# Patient Record
Sex: Female | Born: 1995 | Race: White | Hispanic: No | Marital: Single | Smoking: Never smoker
Health system: Southern US, Community
[De-identification: ages and names within clinical notes are randomized; demographics above are authoritative.]

## PROBLEM LIST (undated history)

## (undated) DIAGNOSIS — D839 Common variable immunodeficiency, unspecified: Secondary | ICD-10-CM

## (undated) DIAGNOSIS — D696 Thrombocytopenia, unspecified: Secondary | ICD-10-CM

## (undated) DIAGNOSIS — Q219 Congenital malformation of cardiac septum, unspecified: Secondary | ICD-10-CM

## (undated) DIAGNOSIS — R7989 Other specified abnormal findings of blood chemistry: Secondary | ICD-10-CM

## (undated) DIAGNOSIS — Q130 Coloboma of iris: Secondary | ICD-10-CM

## (undated) DIAGNOSIS — Q02 Microcephaly: Secondary | ICD-10-CM

## (undated) DIAGNOSIS — Q898 Other specified congenital malformations: Secondary | ICD-10-CM

## (undated) DIAGNOSIS — H9193 Unspecified hearing loss, bilateral: Secondary | ICD-10-CM

## (undated) DIAGNOSIS — E049 Nontoxic goiter, unspecified: Secondary | ICD-10-CM

## (undated) DIAGNOSIS — Q605 Renal hypoplasia, unspecified: Secondary | ICD-10-CM

## (undated) DIAGNOSIS — R945 Abnormal results of liver function studies: Secondary | ICD-10-CM

## (undated) DIAGNOSIS — R6250 Unspecified lack of expected normal physiological development in childhood: Secondary | ICD-10-CM

## (undated) DIAGNOSIS — Z8774 Personal history of (corrected) congenital malformations of heart and circulatory system: Secondary | ICD-10-CM

## (undated) DIAGNOSIS — E063 Autoimmune thyroiditis: Secondary | ICD-10-CM

## (undated) DIAGNOSIS — Z931 Gastrostomy status: Secondary | ICD-10-CM

## (undated) DIAGNOSIS — R620 Delayed milestone in childhood: Secondary | ICD-10-CM

## (undated) DIAGNOSIS — N938 Other specified abnormal uterine and vaginal bleeding: Secondary | ICD-10-CM

## (undated) DIAGNOSIS — R51 Headache: Secondary | ICD-10-CM

## (undated) DIAGNOSIS — R000 Tachycardia, unspecified: Secondary | ICD-10-CM

## (undated) DIAGNOSIS — L80 Vitiligo: Secondary | ICD-10-CM

## (undated) HISTORY — PX: MYRINGOTOMY WITH TUBE PLACEMENT: SHX5663

## (undated) HISTORY — DX: Gastrostomy status: Z93.1

## (undated) HISTORY — PX: EYE MUSCLE SURGERY: SHX370

## (undated) HISTORY — PX: OTHER SURGICAL HISTORY: SHX169

## (undated) HISTORY — DX: Other specified congenital malformations: Q89.8

## (undated) HISTORY — DX: Autoimmune thyroiditis: E06.3

## (undated) HISTORY — DX: Tachycardia, unspecified: R00.0

## (undated) HISTORY — DX: Coloboma of iris: Q13.0

## (undated) HISTORY — DX: Unspecified lack of expected normal physiological development in childhood: R62.50

## (undated) HISTORY — PX: ASD REPAIR: SHX258

## (undated) HISTORY — PX: APPENDECTOMY: SHX54

## (undated) HISTORY — DX: Thrombocytopenia, unspecified: D69.6

## (undated) HISTORY — PX: CARDIAC SURGERY: SHX584

## (undated) HISTORY — DX: Congenital malformation of cardiac septum, unspecified: Q21.9

## (undated) HISTORY — PX: SINUS SURGERY WITH INSTATRAK: SHX5215

## (undated) HISTORY — DX: Other specified abnormal uterine and vaginal bleeding: N93.8

## (undated) HISTORY — DX: Renal hypoplasia, unspecified: Q60.5

## (undated) HISTORY — DX: Personal history of (corrected) congenital malformations of heart and circulatory system: Z87.74

## (undated) HISTORY — DX: Abnormal results of liver function studies: R94.5

## (undated) HISTORY — PX: VAGINAL HYSTERECTOMY: SUR661

## (undated) HISTORY — DX: Common variable immunodeficiency, unspecified: D83.9

## (undated) HISTORY — PX: LIVER BIOPSY: SHX301

## (undated) HISTORY — DX: Microcephaly: Q02

## (undated) HISTORY — DX: Unspecified hearing loss, bilateral: H91.93

## (undated) HISTORY — DX: Vitiligo: L80

## (undated) HISTORY — DX: Nontoxic goiter, unspecified: E04.9

## (undated) HISTORY — DX: Other specified abnormal findings of blood chemistry: R79.89

## (undated) HISTORY — PX: NISSEN FUNDOPLICATION: SHX2091

## (undated) HISTORY — DX: Headache: R51

## (undated) HISTORY — DX: Delayed milestone in childhood: R62.0

---
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
## 2015-08-20 ENCOUNTER — Other Ambulatory Visit: Payer: Self-pay | Admitting: Student

## 2015-08-20 DIAGNOSIS — K7030 Alcoholic cirrhosis of liver without ascites: Secondary | ICD-10-CM

## 2016-05-31 ENCOUNTER — Emergency Department (HOSPITAL_COMMUNITY)
Admission: EM | Admit: 2016-05-31 | Discharge: 2016-05-31 | Disposition: A | Discharge status: Other Acute Inpatient Hospital | Payer: 59 | Attending: Emergency Medicine | Admitting: Emergency Medicine

## 2016-05-31 ENCOUNTER — Encounter (HOSPITAL_COMMUNITY): Payer: Self-pay | Admitting: Emergency Medicine

## 2016-05-31 DIAGNOSIS — Q898 Other specified congenital malformations: Secondary | ICD-10-CM | POA: Diagnosis not present

## 2016-05-31 DIAGNOSIS — E860 Dehydration: Secondary | ICD-10-CM | POA: Diagnosis not present

## 2016-05-31 DIAGNOSIS — E876 Hypokalemia: Secondary | ICD-10-CM | POA: Diagnosis not present

## 2016-05-31 DIAGNOSIS — R197 Diarrhea, unspecified: Secondary | ICD-10-CM

## 2016-05-31 DIAGNOSIS — D72819 Decreased white blood cell count, unspecified: Secondary | ICD-10-CM

## 2016-05-31 DIAGNOSIS — Z79899 Other long term (current) drug therapy: Secondary | ICD-10-CM | POA: Diagnosis not present

## 2016-05-31 DIAGNOSIS — Q8981 Kabuki syndrome: Secondary | ICD-10-CM

## 2016-05-31 LAB — LIPASE, BLOOD: Lipase: 20 U/L (ref 11–51)

## 2016-05-31 LAB — DIFFERENTIAL
BASOS ABS: 0 10*3/uL (ref 0.0–0.1)
BASOS PCT: 0 %
EOS ABS: 0 10*3/uL (ref 0.0–0.7)
Eosinophils Relative: 0 %
Lymphocytes Relative: 9 %
Lymphs Abs: 0.3 10*3/uL — ABNORMAL LOW (ref 0.7–4.0)
MONO ABS: 0.4 10*3/uL (ref 0.1–1.0)
MONOS PCT: 12 %
NEUTROS ABS: 2.3 10*3/uL (ref 1.7–7.7)
Neutrophils Relative %: 79 %

## 2016-05-31 LAB — CBC
HEMATOCRIT: 38.5 % (ref 36.0–46.0)
Hemoglobin: 13 g/dL (ref 12.0–15.0)
MCH: 26.3 pg (ref 26.0–34.0)
MCHC: 33.8 g/dL (ref 30.0–36.0)
MCV: 77.8 fL — ABNORMAL LOW (ref 78.0–100.0)
Platelets: 99 10*3/uL — ABNORMAL LOW (ref 150–400)
RBC: 4.95 MIL/uL (ref 3.87–5.11)
WBC: 3.1 10*3/uL — AB (ref 4.0–10.5)

## 2016-05-31 LAB — URINALYSIS, ROUTINE W REFLEX MICROSCOPIC
Bilirubin Urine: NEGATIVE
Glucose, UA: NEGATIVE mg/dL
Hgb urine dipstick: NEGATIVE
Ketones, ur: NEGATIVE mg/dL
LEUKOCYTES UA: NEGATIVE
Nitrite: NEGATIVE
PH: 7 (ref 5.0–8.0)
Protein, ur: NEGATIVE mg/dL
Specific Gravity, Urine: 1.004 — ABNORMAL LOW (ref 1.005–1.030)

## 2016-05-31 LAB — COMPREHENSIVE METABOLIC PANEL
ALBUMIN: 3.4 g/dL — AB (ref 3.5–5.0)
ALT: 44 U/L (ref 14–54)
AST: 41 U/L (ref 15–41)
Alkaline Phosphatase: 131 U/L — ABNORMAL HIGH (ref 38–126)
Anion gap: 9 (ref 5–15)
BILIRUBIN TOTAL: 0.5 mg/dL (ref 0.3–1.2)
BUN: <5 mg/dL — ABNORMAL LOW (ref 6–20)
CO2: 22 mmol/L (ref 22–32)
Calcium: 9.1 mg/dL (ref 8.9–10.3)
Chloride: 103 mmol/L (ref 101–111)
Creatinine, Ser: 0.63 mg/dL (ref 0.44–1.00)
GFR calc non Af Amer: >60 mL/min (ref >60)
Glucose, Bld: 91 mg/dL (ref 65–99)
POTASSIUM: 2.3 mmol/L — AB (ref 3.5–5.1)
Sodium: 134 mmol/L — ABNORMAL LOW (ref 135–145)
Total Protein: 7.3 g/dL (ref 6.5–8.1)

## 2016-05-31 LAB — I-STAT BETA HCG BLOOD, ED (MC, WL, AP ONLY)

## 2016-05-31 LAB — MAGNESIUM: MAGNESIUM: 1.9 mg/dL (ref 1.7–2.4)

## 2016-05-31 LAB — I-STAT CG4 LACTIC ACID, ED: LACTIC ACID, VENOUS: 1.24 mmol/L (ref 0.5–1.9)

## 2016-05-31 MED ORDER — KCL IN DEXTROSE-NACL 20-5-0.45 MEQ/L-%-% IV SOLN
Freq: Once | INTRAVENOUS | Status: DC
  Filled 2016-05-31 (×2): qty 1000

## 2016-05-31 MED ORDER — POTASSIUM CHLORIDE CRYS ER 20 MEQ PO TBCR: 40.0000 meq | EXTENDED_RELEASE_TABLET | Freq: Once | ORAL | Status: DC

## 2016-05-31 MED ORDER — SODIUM CHLORIDE 0.9 % IV BOLUS (SEPSIS)
1000.0000 mL | Freq: Once | INTRAVENOUS | Status: AC
  Administered 2016-05-31: 1000 mL via INTRAVENOUS

## 2016-05-31 MED ORDER — POTASSIUM CHLORIDE 2 MEQ/ML IV SOLN
30.0000 meq | Freq: Once | INTRAVENOUS | Status: AC
  Administered 2016-05-31: 30 meq via INTRAVENOUS
  Filled 2016-05-31: qty 15

## 2016-05-31 MED ORDER — MAGNESIUM SULFATE 2 GM/50ML IV SOLN
2.0000 g | Freq: Once | INTRAVENOUS | Status: AC
  Administered 2016-05-31: 2 g via INTRAVENOUS
  Filled 2016-05-31: qty 50

## 2016-05-31 MED ORDER — METRONIDAZOLE IN NACL 5-0.79 MG/ML-% IV SOLN: 500.0000 mg | Freq: Once | INTRAVENOUS | Status: DC

## 2016-05-31 MED ORDER — SODIUM CHLORIDE 0.9 % IV BOLUS (SEPSIS): 1000.0000 mL | Freq: Once | INTRAVENOUS | Status: DC

## 2016-05-31 MED ORDER — PROMETHAZINE HCL 25 MG/ML IJ SOLN
12.5000 mg | Freq: Once | INTRAMUSCULAR | Status: AC
  Administered 2016-05-31: 12.5 mg via INTRAVENOUS
  Filled 2016-05-31: qty 1

## 2016-05-31 MED ORDER — HYDROCORTISONE NA SUCCINATE PF 100 MG IJ SOLR
50.0000 mg | Freq: Once | INTRAMUSCULAR | Status: AC
  Administered 2016-05-31: 50 mg via INTRAVENOUS
  Filled 2016-05-31: qty 2

## 2016-05-31 MED ORDER — FIDAXOMICIN 200 MG PO TABS
200.0000 mg | ORAL_TABLET | Freq: Two times a day (BID) | ORAL | Status: DC
  Administered 2016-05-31: 200 mg via ORAL
  Filled 2016-05-31: qty 1

## 2016-05-31 MED ORDER — DEXTROSE 5 % IV SOLN
2.0000 g | Freq: Once | INTRAVENOUS | Status: AC
  Administered 2016-05-31: 2 g via INTRAVENOUS
  Filled 2016-05-31: qty 2

## 2016-05-31 NOTE — ED Notes (Signed)
IV Team Consult [facility]

## 2016-05-31 NOTE — ED Notes (Signed)
IV Team at bedside 

## 2016-05-31 NOTE — ED Triage Notes (Signed)
PT presents from home via POV with Mom and Dad with c/o watery diarrhea since 05/23/16; both parents also had similar symptoms but have since recovered while pt has not improved. Mom started pt on Vancomycin 250mg  PO TID on 05/23/2016 because of pt history of C-diff in the past without improvement. She has also given Immodium, Tylenol, Zofran, Phenergan, Compazine, Kytril, Ammend without relief. PT denies pain and mom does not think patient has exhibited symptoms of pain. Pt has history of Fecal transplant; [facility]

## 2016-05-31 NOTE — ED Notes (Signed)
[facility] at bedside 

## 2016-05-31 NOTE — ED Notes (Signed)
Spoke with Dr. Erma HeritageIsaacs regarding care and updated patient and her parents on plan.

## 2016-05-31 NOTE — ED Notes (Signed)
2.3 Potassium received. Dr. Erma HeritageIsaacs Aware

## 2016-05-31 NOTE — ED Provider Notes (Signed)
[facility]ENCY DEPT Provider Note   CSN: [id number] Arrival date & time: 05/31/16  0730     History   Chief Complaint Chief Complaint  Patient presents with  . Diarrhea    HPI BONNI NEUSER is a 21 y.o. female.  HPI 21 yo female with past medical history of congenital immune deficiency who presents with fever and diarrhea. Patient has history of recurrent C. Difficile. Per mother report, patient has had increasing abdominal pain, watery diarrhea, and fevers for the last several days. She began by mouth vancomycin 2 days ago with no improvement in her symptoms. Over the last 24 hours, she is in spike fevers to 102. She called her oncologist at [facility] who advised her to present here for initial evaluation followed by transfer. Patient is otherwise hemodynamically stable. She currently states she feels "sick" but denies any significant pain. She's had no blood in her diarrhea. She has had significant nausea as well but has a history of recurrent nausea without vomiting due to Nissen. Said no vomiting.  Past Medical History:  Diagnosis Date  . Cardiac septal defect   . CHARGE association   . Coloboma of eye   . Common variable immunodeficiency (HCC)   . Delayed milestones   . Dysfunctional uterine bleeding   . Elevated LFTs   . Gastrostomy tube in place [facility])   . Goiter   . Headache(784.0)   . Hearing loss of both ears   . Hypoplastic kidney   . Microcephalus (HCC)   . Physical growth delay   . S/P VSD repair   . Tachycardia   . Thrombocytopenia (HCC)   . Thyroiditis, autoimmune   . Vitiligo     Patient Active Problem List   Diagnosis Date Noted  . Migraine without aura, without mention of intractable migraine without mention of status migrainosus 11/08/2012  . Persistent vomiting 11/08/2012  . 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 Delayed milestones 11/08/2012  . Microcephalus (HCC) 11/08/2012  . CHARGE association   . Coloboma of eye   . Cardiac  septal defect   . Physical growth delay   . Hypoplastic kidney   . Hearing loss of both ears   . Thrombocytopenia (HCC)   . Common variable immunodeficiency (HCC)   . Vitiligo   . Thyroiditis, autoimmune   . Goiter   . Dysfunctional uterine bleeding   . Elevated LFTs   . Tachycardia   . Other specified congenital anomalies 09/22/2010  . Goiter, unspecified 09/22/2010  . Lack of expected normal physiological development in childhood 09/22/2010    Past Surgical History:  Procedure Laterality Date  . APPENDECTOMY    . ASD REPAIR    . CARDIAC SURGERY    . EYE MUSCLE SURGERY    . inguinal herniorraphies    . LIVER BIOPSY    . MYRINGOTOMY WITH TUBE PLACEMENT    . NISSEN FUNDOPLICATION    . SINUS SURGERY WITH INSTATRAK    . VAGINAL HYSTERECTOMY     LAVH    OB History    Gravida Para Term Preterm AB Living   0             SAB TAB Ectopic Multiple Live Births                   Home Medications    Prior to Admission medications   Medication Sig Start Date End Date Taking? Authorizing Provider  acetaminophen (TYLENOL) 500 MG tablet Take 500 mg  by mouth every 6 (six) hours as needed for moderate pain or headache.   Yes Historical Provider, MD  aprepitant (EMEND) 80 MG capsule Take 80 mg by mouth as needed (for nausea).    Yes Historical Provider, MD  atenolol (TENORMIN) 25 MG tablet Take 12.5 mg by mouth daily.    Yes Historical Provider, MD  azelastine (ASTELIN) 0.1 % nasal spray Place 1 spray into both nostrils 2 (two) times daily. Use in each nostril as directed   Yes Historical Provider, MD  Azelastine-Fluticasone (DYMISTA) 137-50 MCG/ACT SUSP Place 2 sprays into both nostrils 2 (two) times daily.   Yes Historical Provider, MD  baclofen (LIORESAL) 10 MG tablet Take 10 mg by mouth 3 (three) times daily.   Yes Historical Provider, MD  Cholecalciferol (VITAMIN D3) 5000 units CAPS Take 5,000 Units by mouth 3 (three) times a week.   Yes Historical Provider, MD  Coenzyme Q10 (CO  Q-10) 100 MG CAPS Take 100 mg by mouth daily.    Yes Historical Provider, MD  cyproheptadine (PERIACTIN) 4 MG tablet Take 6 mg by mouth daily.  02/11/14  Yes Historical Provider, MD  desvenlafaxine (PRISTIQ) 50 MG 24 hr tablet Take 50 mg by mouth daily.   Yes Historical Provider, MD  diphenhydrAMINE (BENADRYL) 25 mg capsule Take 25 mg by mouth every 6 (six) hours as needed for itching or allergies.   Yes Historical Provider, MD  esomeprazole (NEXIUM) 40 MG capsule Take 40 mg by mouth daily.    Yes Historical Provider, MD  ferrous sulfate 325 (65 FE) MG tablet Take 325 mg by mouth 2 (two) times daily.   Yes Historical Provider, MD  hydrocortisone (CORTEF) 10 MG tablet Take 15 mg by mouth 4 (four) times daily.   Yes Historical Provider, MD  Immune Globulin, Human, 1 GM/5ML SOLN Inject 7 g into the skin 2 (two) times a week.    Yes Historical Provider, MD  L-Lysine 1000 MG TABS Take 1,000 mg by mouth daily.   Yes Historical Provider, MD  L-METHYLFOLATE PO Take 1 tablet by mouth daily.   Yes Historical Provider, MD  loperamide (IMODIUM) 2 MG capsule Take 2-4 mg by mouth as needed for diarrhea or loose stools.   Yes Historical Provider, MD  MAGNESIUM PO Take 2 capsules by mouth at bedtime.   Yes Historical Provider, MD  Multiple Vitamins-Minerals (MULTIVITAMIN PO) Take 1 tablet by mouth daily.   Yes Historical Provider, MD  ondansetron (ZOFRAN) 4 MG tablet Take 4-8 mg by mouth every 8 (eight) hours as needed for nausea or vomiting.   Yes Historical Provider, MD  ondansetron (ZOFRAN-ODT) 4 MG disintegrating tablet Take 4-8 mg by mouth every 8 (eight) hours as needed for nausea or vomiting.  10/21/12  Yes Historical Provider, MD  Probiotic Product (PROBIOTIC PO) Take 1 capsule by mouth 2 (two) times daily.   Yes Historical Provider, MD  prochlorperazine (COMPAZINE) 10 MG tablet Take 10 mg by mouth every 6 (six) hours as needed for nausea or vomiting.   Yes Historical Provider, MD  promethazine (PHENERGAN) 25  MG tablet Take 25 mg by mouth every 6 (six) hours as needed for nausea or vomiting.   Yes Historical Provider, MD  Riboflavin (VITAMIN B-2) 50 MG TABS Take 50 mg by mouth daily.    Yes Historical Provider, MD  scopolamine (TRANSDERM-SCOP) 1 MG/3DAYS Place 1 patch onto the skin every 3 (three) days.   Yes Historical Provider, MD  sodium bicarbonate 650 MG tablet Take 650  mg by mouth 3 (three) times daily.   Yes Historical Provider, MD  sucralfate (CARAFATE) 1 GM/10ML suspension Take 1 g by mouth 3 (three) times daily.   Yes Historical Provider, MD  tiZANidine (ZANAFLEX) 4 MG tablet Take 4-12 mg by mouth every 6 (six) hours as needed for muscle spasms.   Yes Historical Provider, MD  ursodiol (ACTIGALL) 300 MG capsule Take 300 mg by mouth 2 (two) times daily.     Yes Historical Provider, MD  zolmitriptan (ZOMIG) 5 MG tablet Take 5 mg by mouth as needed for migraine.   Yes Historical Provider, MD  zonisamide (ZONEGRAN) 25 MG capsule Take 175 mg by mouth at bedtime.  03/18/14  Yes Historical Provider, MD    Family History No family history on file.  Social History Social History  Substance Use Topics  . Smoking status: Never Smoker  . Smokeless tobacco: Never Used  . Alcohol use No     Allergies   Methocarbamol; Amitiza [lubiprostone]; Sulfa antibiotics; Topamax [topiramate]; Nsaids; and Tobramycin-dexamethasone   Review of Systems Review of Systems  Constitutional: Positive for appetite change, chills, fatigue and fever.  HENT: Negative for congestion, rhinorrhea and sore throat.   Eyes: Negative for visual disturbance.  Respiratory: Negative for cough, shortness of breath and wheezing.   Cardiovascular: Negative for chest pain and leg swelling.  Gastrointestinal: Positive for abdominal pain, diarrhea and nausea. Negative for vomiting.  Genitourinary: Negative for dysuria, flank pain, vaginal bleeding and vaginal discharge.  Musculoskeletal: Negative for neck pain.  Skin: Negative  for rash.  Allergic/Immunologic: Negative for immunocompromised state.  Neurological: Negative for syncope and headaches.  Hematological: Does not bruise/bleed easily.  All other systems reviewed and are negative.    Physical Exam Updated Vital Signs BP 113/69   Pulse 103   Temp 99.8 F (37.7 C) (Oral)   Resp 26   Ht 4\' 4"  (1.321 m)   Wt 89 lb (40.4 kg)   LMP 07/03/2012   SpO2 100%   BMI 23.14 kg/m   Physical Exam  Constitutional: She is oriented to person, place, and time. She appears well-developed and well-nourished. No distress.  HENT:  Head: Normocephalic and atraumatic.  Eyes: Conjunctivae are normal.  Neck: Neck supple.  Cardiovascular: Normal rate, regular rhythm and normal heart sounds.  Exam reveals no friction rub.   No murmur heard. Pulmonary/Chest: Effort normal and breath sounds normal. No respiratory distress. She has no wheezes. She has no rales.  Abdominal: Soft. Bowel sounds are normal. She exhibits no distension. There is tenderness (Mild, generalized). There is no rebound and no guarding.  Musculoskeletal: She exhibits no edema.  Neurological: She is alert and oriented to person, place, and time. She exhibits normal muscle tone.  Skin: Skin is warm. Capillary refill takes less than 2 seconds.  Psychiatric: She has a normal mood and affect.  Nursing note and vitals reviewed.    ED Treatments / Results  Labs (all labs ordered are listed, but only abnormal results are displayed) Labs Reviewed  COMPREHENSIVE METABOLIC PANEL - Abnormal; Notable for the following:       Result Value   Sodium 134 (*)    Potassium 2.3 (*)    BUN <5 (*)    Albumin 3.4 (*)    Alkaline Phosphatase 131 (*)    All other components within normal limits  CBC - Abnormal; Notable for the following:    WBC 3.1 (*)    MCV 77.8 (*)    Platelets 99 (*)  All other components within normal limits  DIFFERENTIAL - Abnormal; Notable for the following:    Lymphs Abs 0.3 (*)     All other components within normal limits  CULTURE, BLOOD (ROUTINE X 2)  CULTURE, BLOOD (ROUTINE X 2)  URINE CULTURE  LIPASE, BLOOD  URINALYSIS, ROUTINE W REFLEX MICROSCOPIC  MAGNESIUM  I-STAT BETA HCG BLOOD, ED ([facility], [facility], [facility] ONLY)  I-STAT CG4 LACTIC ACID, ED  I-STAT CG4 LACTIC ACID, ED    EKG  EKG Interpretation None       Radiology No results found.  Procedures Procedures (including critical care time)  Medications Ordered in ED Medications  sodium chloride 0.9 % bolus 1,000 mL (1,000 mLs Intravenous New Bag/Given 05/31/16 1054)  fidaxomicin (DIFICID) tablet 200 mg (200 mg Oral Given 05/31/16 0947)  magnesium sulfate IVPB 2 g 50 mL (2 g Intravenous New Bag/Given 05/31/16 1050)  potassium chloride 30 mEq in sodium chloride 0.9 % 265 mL (KCL MULTIRUN) IVPB (30 mEq Intravenous Given 05/31/16 1051)  hydrocortisone sodium succinate (SOLU-CORTEF) 100 MG injection 50 mg (not administered)  sodium chloride 0.9 % bolus 1,000 mL (not administered)  ceFEPIme (MAXIPIME) 2 g in dextrose 5 % 50 mL IVPB (0 g Intravenous Stopped 05/31/16 0921)  sodium chloride 0.9 % bolus 1,000 mL (0 mLs Intravenous Stopped 05/31/16 1055)  promethazine (PHENERGAN) injection 12.5 mg (12.5 mg Intravenous Given 05/31/16 0951)     Initial Impression / Assessment and Plan / ED Course  I have reviewed the triage vital signs and the nursing notes.  Pertinent labs & imaging results that were available during my care of the patient were reviewed by me and considered in my medical decision making (see chart for details).  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 History of C.Diff and has been on PO vanc x several days. On arrival, pt systemically well appearing. Abdomen without rebound or guarding. Lab work shows mild elevation in baseline WBC, baseline thrombocytopenia. CMP c/w severe diarrheal illness with Na 134, K 2.3, Albumin 3.4. Will replete, continue IVF. For her  fevers at home, will give Cefepime, fidaxomicin (pt allergic to flagyl per report, already on PO vanc). BCx have been sent. GI pathogen panel ordered. Will d/w [facility]. Given stable exam, reassuring vitals, do not feel CT indicated.  D/[facility] Vickki Muff [facility], who has accepted. IVF running. Will continue mIVF, replete K, and transfer to [facility].  Final Clinical Impressions(s) / ED Diagnoses   Final diagnoses:  Dehydration  Diarrhea in adult patient  Hypokalemia  Leukopenia, unspecified type  Kabuki syndrome    New Prescriptions New Prescriptions   No medications on file     Shaune Pollack, MD 05/31/16 1120

## 2016-06-01 LAB — BLOOD CULTURE ID PANEL (REFLEXED)
ACINETOBACTER BAUMANNII: NOT DETECTED
CANDIDA ALBICANS: NOT DETECTED
CANDIDA PARAPSILOSIS: NOT DETECTED
CARBAPENEM RESISTANCE: NOT DETECTED
Candida glabrata: NOT DETECTED
Candida krusei: NOT DETECTED
Candida tropicalis: NOT DETECTED
ENTEROBACTER CLOACAE COMPLEX: NOT DETECTED
ENTEROBACTERIACEAE SPECIES: DETECTED — AB
ENTEROCOCCUS SPECIES: NOT DETECTED
Escherichia coli: NOT DETECTED
HAEMOPHILUS INFLUENZAE: NOT DETECTED
KLEBSIELLA PNEUMONIAE: NOT DETECTED
Klebsiella oxytoca: NOT DETECTED
LISTERIA MONOCYTOGENES: NOT DETECTED
NEISSERIA MENINGITIDIS: NOT DETECTED
PSEUDOMONAS AERUGINOSA: NOT DETECTED
Proteus species: NOT DETECTED
STAPHYLOCOCCUS AUREUS BCID: NOT DETECTED
STREPTOCOCCUS AGALACTIAE: NOT DETECTED
STREPTOCOCCUS PNEUMONIAE: NOT DETECTED
STREPTOCOCCUS PYOGENES: NOT DETECTED
STREPTOCOCCUS SPECIES: NOT DETECTED
Serratia marcescens: NOT DETECTED
Staphylococcus species: NOT DETECTED

## 2016-06-01 LAB — URINE CULTURE
Culture: 20000 — AB
Special Requests: NORMAL

## 2016-06-03 ENCOUNTER — Telehealth: Payer: Self-pay

## 2016-06-03 LAB — CULTURE, BLOOD (ROUTINE X 2)

## 2016-06-03 NOTE — Telephone Encounter (Signed)
Blood culture report faxed to Dr. Allayne GitelmanGold [facility] [phone]

## 2016-06-24 LAB — CULTURE, BLOOD (ROUTINE X 2)

## 2016-06-25 ENCOUNTER — Telehealth: Payer: Self-pay

## 2016-06-25 NOTE — Telephone Encounter (Signed)
Post ED Visit - Positive Culture Follow-up  Culture report reviewed by antimicrobial stewardship pharmacist:  []  Enzo BiNathan Batchelder, Pharm.D. []  Celedonio MiyamotoJeremy Frens, Pharm.D., BCPS [x]  Garvin FilaMike Maccia, Pharm.D. []  Georgina PillionElizabeth Martin, Pharm.D., BCPS []  PowayMinh Pham, [place]arm[place]D., BCPS, AAHIVP []  Estella HuskMichelle Turner, Pharm.D., BCPS, AAHIVP []  Tennis Mustassie Stewart, [address] []  Sherle Poeob Vincent, [address]  Positive Blood culture. [facility]  and no further patient follow-up is required at this time.  Jerry CarasCullom, Marcell Pfeifer Burnett 06/25/2016, 9:49 AM

## 2016-08-28 IMAGING — CT CT TEMPORAL BONES W/O CM
4 of 6 series · 18 of 30 positions shown, 19 images · non-contrast
Comparison: Paranasal sinus CT 07/10/2012.

CLINICAL DATA: Hearing loss. RIGHT ear drainage for 10 days.
BILATERAL married ostomies. History of sinus surgery.

EXAM:
CT TEMPORAL BONES WITHOUT CONTRAST
TECHNIQUE: Axial and coronal plane CT imaging of the petrous temporal bones was
performed with thin-collimation image reconstruction. No intravenous
contrast was administered. Multiplanar CT image reconstructions were
also generated.

[Series 3: ax mag right · axial · 0.20mm/px · z∈[-21,+16]mm · 4 of 197 slices shown, 5 images]
[im 40/197  brain]
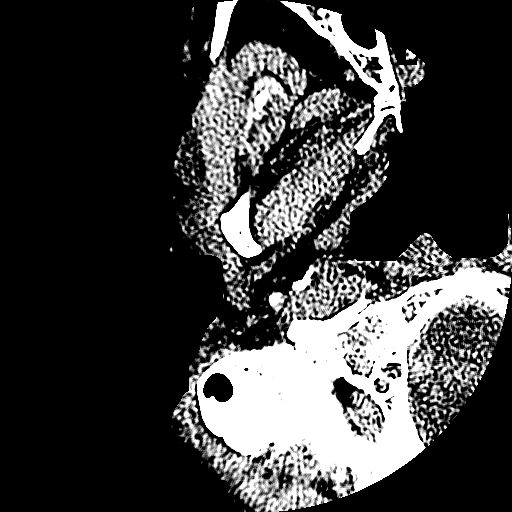
[im 40/197  bone]
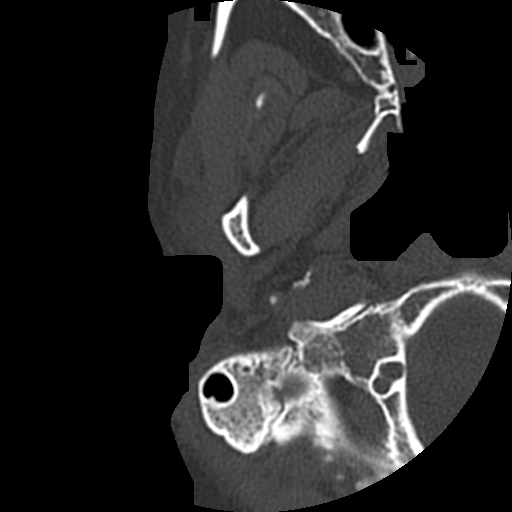
[im 79/197  bone]
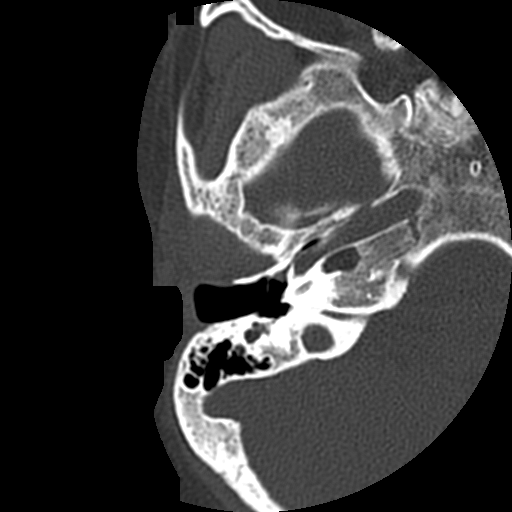
[im 118/197  bone]
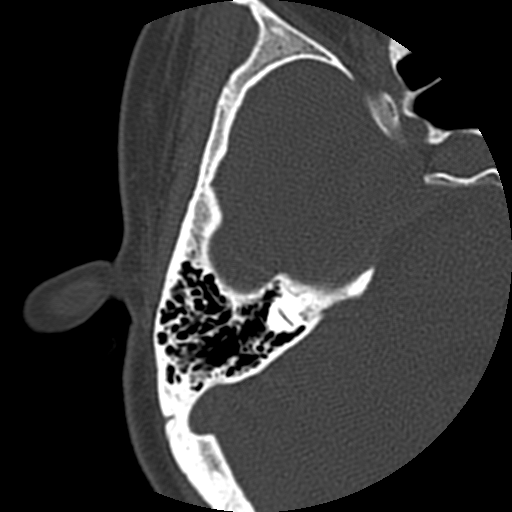
[im 157/197  bone]
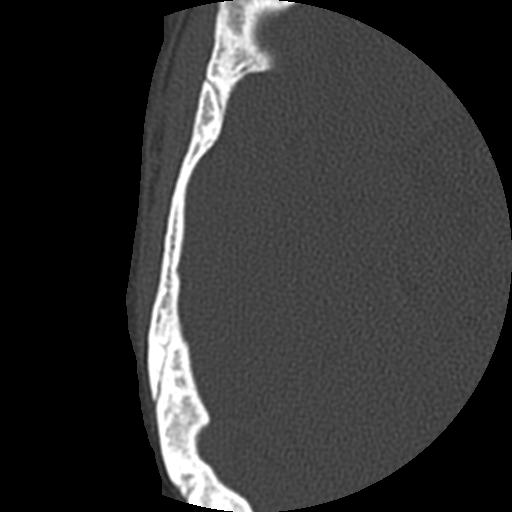

[Series 4: ax mag left · axial · 0.20mm/px · z∈[-18,+13]mm · 3 of 197 slices shown]
[im 50/197  bone]
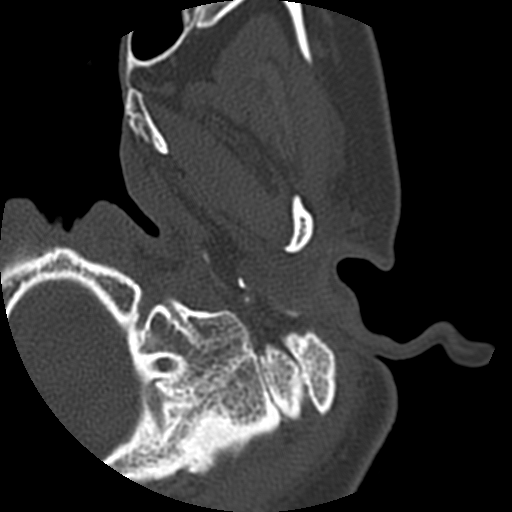
[im 99/197  bone]
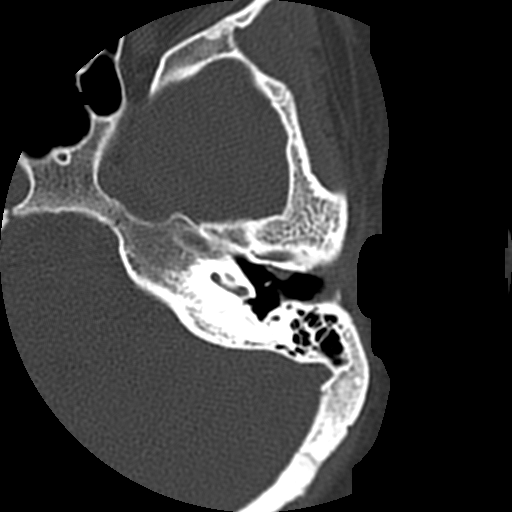
[im 148/197  bone]
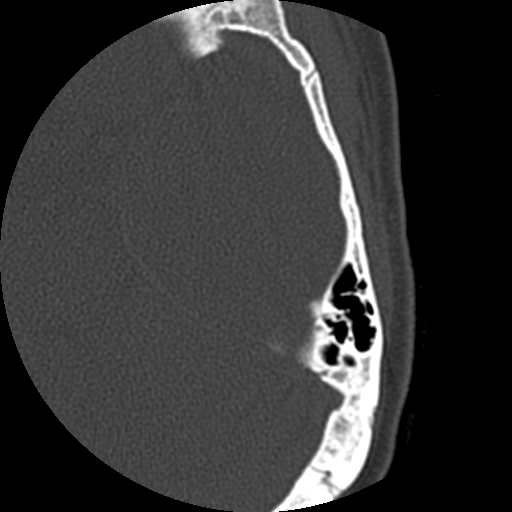

[Series 300: cor mag right · coronal · 0.20mm/px · 6 of 307 slices shown]
[im 44/307  bone]
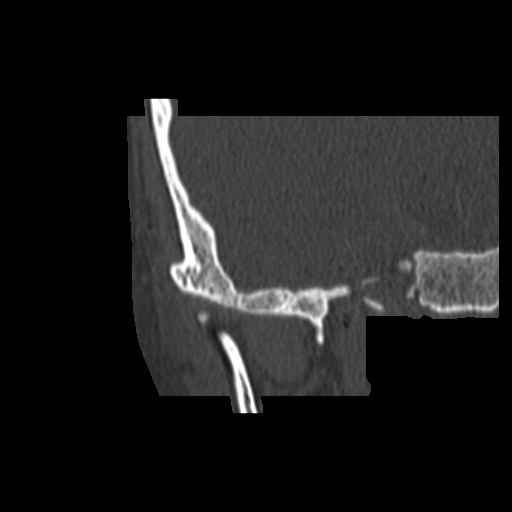
[im 88/307  bone]
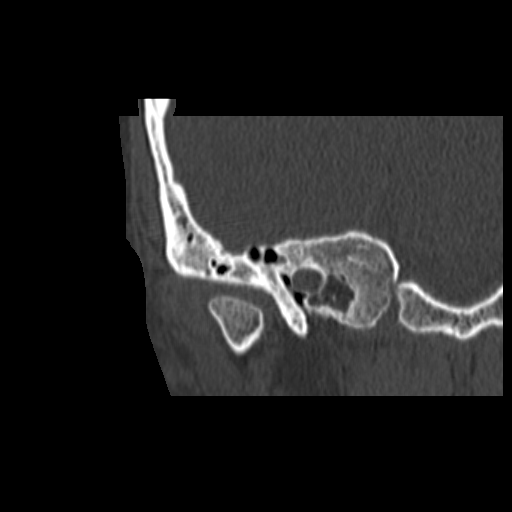
[im 132/307  bone]
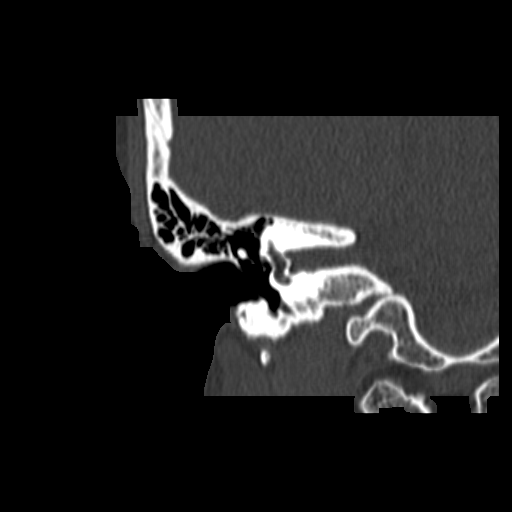
[im 175/307  bone]
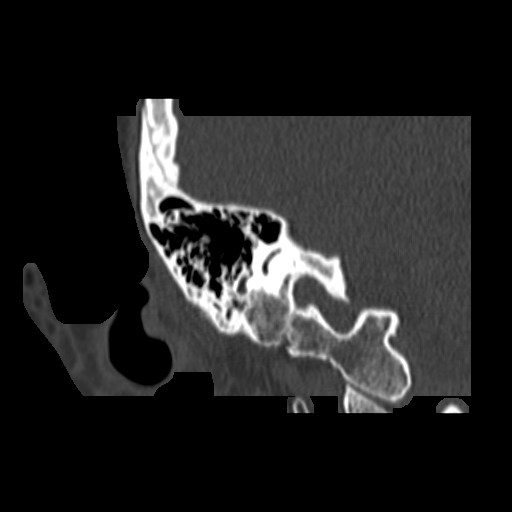
[im 219/307  bone]
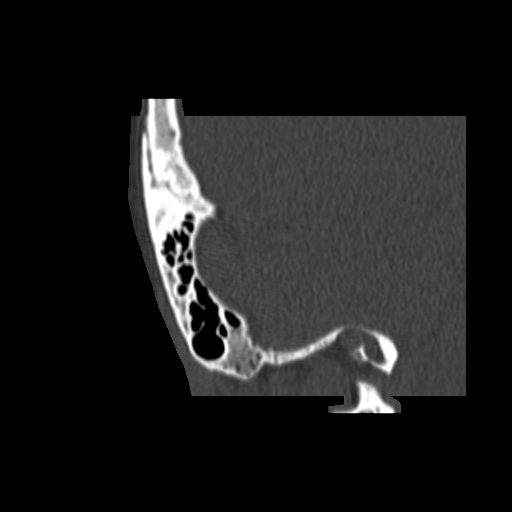
[im 263/307  bone]
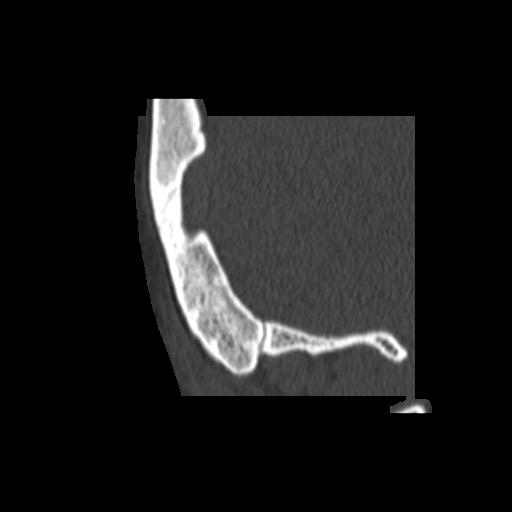

[Series 400: cor mag left · coronal · 0.20mm/px · 5 of 265 slices shown]
[im 45/265  bone]
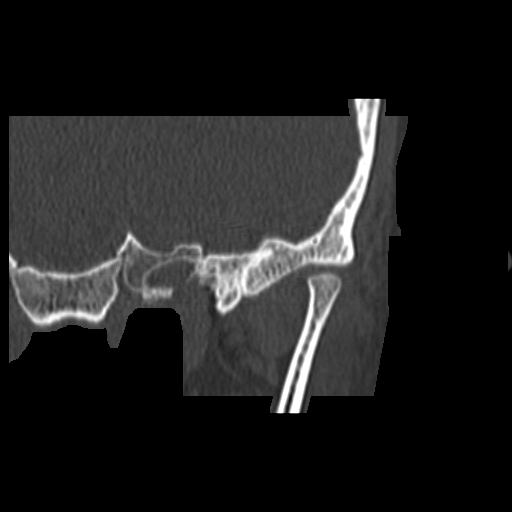
[im 89/265  bone]
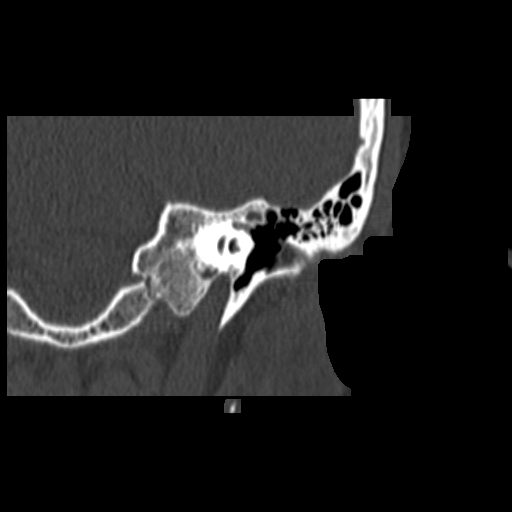
[im 133/265  bone]
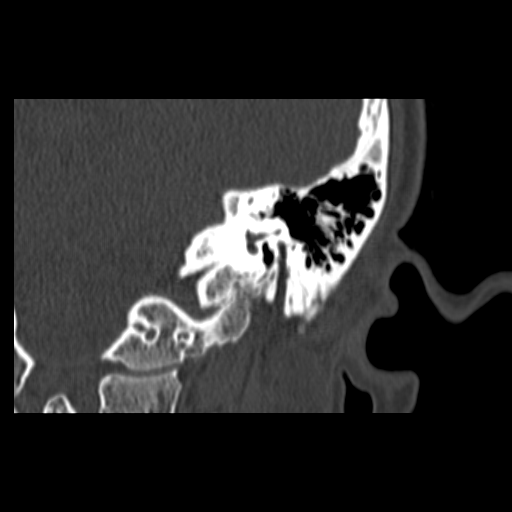
[im 177/265  bone]
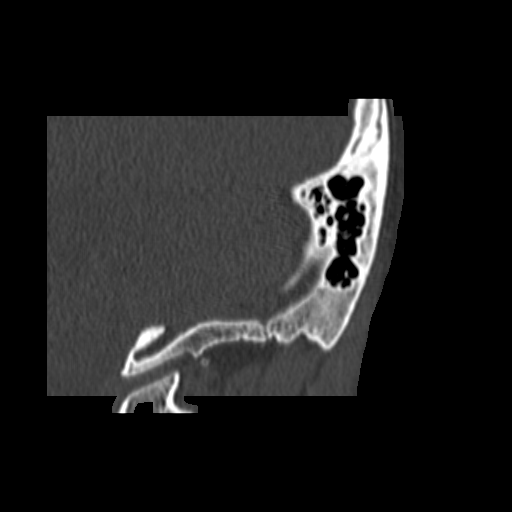
[im 221/265  bone]
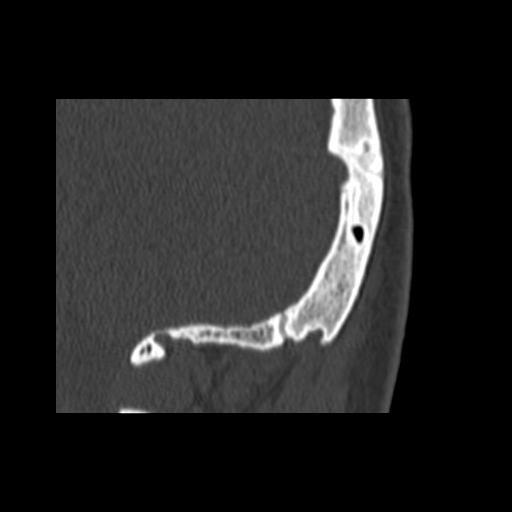

[18 of 30 positions shown; findings below may reference images not displayed]

FINDINGS: RIGHT EAR: Normal external canal. Somewhat retracted tympanic
membrane. Sequelae myringotomy tube. Normal ossicles. Minor fluid
anteromedially. Normal attic and scutum. Tegmen intact. Normal
mastoid. Unremarkable inner ear structures.

LEFT EAR: Mild debris or mucosal thickening external canal. Sequelae
of myringotomy tube with somewhat retracted TM. Ossicles intact. No
layering fluid. Attic and scutum intact. No tegmen erosion. Normal
mastoid. Normal inner ear structures.
IMPRESSION: Minor changes of otitis media, BILATERAL retracted tympanic
membranes, and external otitis. No evidence for cholesteatoma or
ossicular disruption. No changes of mastoiditis or coalescence.

## 2019-07-29 DEATH — deceased
